# Patient Record
Sex: Female | Born: 1965 | Race: Black or African American | Hispanic: No | Marital: Single | Smoking: Never smoker
Health system: Southern US, Community
[De-identification: ages and names within clinical notes are randomized; demographics above are authoritative.]

## PROBLEM LIST (undated history)

## (undated) DIAGNOSIS — Z9889 Other specified postprocedural states: Secondary | ICD-10-CM

## (undated) DIAGNOSIS — C801 Malignant (primary) neoplasm, unspecified: Secondary | ICD-10-CM

## (undated) DIAGNOSIS — L930 Discoid lupus erythematosus: Secondary | ICD-10-CM

## (undated) DIAGNOSIS — R112 Nausea with vomiting, unspecified: Secondary | ICD-10-CM

## (undated) HISTORY — PX: BREAST SURGERY: SHX581

---

## 1999-12-13 HISTORY — PX: BREAST LUMPECTOMY: SHX2

## 2000-01-13 DIAGNOSIS — C801 Malignant (primary) neoplasm, unspecified: Secondary | ICD-10-CM

## 2000-01-13 HISTORY — DX: Malignant (primary) neoplasm, unspecified: C80.1

## 2002-12-12 HISTORY — PX: TOE SURGERY: SHX1073

## 2004-12-12 HISTORY — PX: CHOLECYSTECTOMY: SHX55

## 2014-02-13 ENCOUNTER — Other Ambulatory Visit: Payer: Self-pay | Admitting: Podiatry

## 2014-02-28 NOTE — Addendum Note (Signed)
Addended by: Caprice Beaver on: 02/28/2014 02:27 PM   Modules accepted: Orders

## 2014-03-14 ENCOUNTER — Encounter (HOSPITAL_COMMUNITY): Payer: Self-pay | Admitting: Pharmacy Technician

## 2014-03-19 ENCOUNTER — Encounter (HOSPITAL_COMMUNITY): Payer: Self-pay

## 2014-03-19 ENCOUNTER — Encounter (HOSPITAL_COMMUNITY)
Admission: RE | Admit: 2014-03-19 | Discharge: 2014-03-19 | Disposition: A | Discharge status: Home / Self Care | Payer: BC Managed Care – PPO | Source: Ambulatory Visit | Attending: Podiatry | Admitting: Podiatry

## 2014-03-19 ENCOUNTER — Ambulatory Visit (HOSPITAL_COMMUNITY)
Admission: RE | Admit: 2014-03-19 | Discharge: 2014-03-19 | Disposition: A | Discharge status: Home / Self Care | Payer: BC Managed Care – PPO | Source: Ambulatory Visit | Attending: Podiatry | Admitting: Podiatry

## 2014-03-19 DIAGNOSIS — Z01812 Encounter for preprocedural laboratory examination: Secondary | ICD-10-CM | POA: Insufficient documentation

## 2014-03-19 DIAGNOSIS — M2010 Hallux valgus (acquired), unspecified foot: Secondary | ICD-10-CM | POA: Insufficient documentation

## 2014-03-19 HISTORY — DX: Malignant (primary) neoplasm, unspecified: C80.1

## 2014-03-19 HISTORY — DX: Nausea with vomiting, unspecified: R11.2

## 2014-03-19 HISTORY — DX: Discoid lupus erythematosus: L93.0

## 2014-03-19 HISTORY — DX: Other specified postprocedural states: Z98.890

## 2014-03-19 LAB — BASIC METABOLIC PANEL
BUN: 11 mg/dL (ref 6–23)
CHLORIDE: 105 meq/L (ref 96–112)
CO2: 29 mEq/L (ref 19–32)
Calcium: 9 mg/dL (ref 8.4–10.5)
Creatinine, Ser: 1.14 mg/dL — ABNORMAL HIGH (ref 0.50–1.10)
GFR calc Af Amer: 66 mL/min — ABNORMAL LOW (ref >90)
GFR calc non Af Amer: 57 mL/min — ABNORMAL LOW (ref >90)
GLUCOSE: 92 mg/dL (ref 70–99)
Potassium: 4.2 mEq/L (ref 3.7–5.3)
SODIUM: 143 meq/L (ref 137–147)

## 2014-03-19 LAB — HEMOGLOBIN AND HEMATOCRIT, BLOOD
HCT: 28.7 % — ABNORMAL LOW (ref 36.0–46.0)
HEMOGLOBIN: 9.9 g/dL — AB (ref 12.0–15.0)

## 2014-03-19 LAB — HCG, SERUM, QUALITATIVE: Preg, Serum: NEGATIVE

## 2014-03-19 NOTE — Patient Instructions (Signed)
Amy Gill  03/19/2014   Your procedure is scheduled on:  Thursday, 03/27/14  Report to [name] at 0800 AM.  Call this number if you have problems the morning of surgery: [phone]   Remember:   Do not eat food or drink liquids after midnight.   Take these medicines the morning of surgery with A SIP OF WATER: none   Do not wear jewelry, make-up or nail polish.  Do not wear lotions, powders, or perfumes. You may wear deodorant.  Do not shave 48 hours prior to surgery. Men may shave face and neck.  Do not bring valuables to the hospital.  [facility] is not responsible                  for any belongings or valuables.               Contacts, dentures or bridgework may not be worn into surgery.  Leave suitcase in the car. After surgery it may be brought to your room.  For patients admitted to the hospital, discharge time is determined by your                treatment team.               Patients discharged the day of surgery will not be allowed to drive  home.  Name and phone number of your driver: family  Special Instructions: Shower using CHG 2 nights before surgery and the night before surgery.  If you shower the day of surgery use CHG.  Use special wash - you have one bottle of CHG for all showers.  You should use approximately 1/3 of the bottle for each shower.   Please read over the following fact sheets that you were given: Pain Booklet, Surgical Site Infection Prevention, Anesthesia Post-op Instructions and Care and Recovery After Surgery   Bunionectomy A bunionectomy is surgery to remove a bunion. A bunion is an enlargement of the joint at the base of the big toe. It is made up of bone and soft tissue on the inside part of the joint. Over time, a painful lump appears on the inside of the joint. The big toe begins to point inward toward the second toe. New bone growth can occur and a bone spur may form. The pain eventually causes difficulty walking. A bunion usually results  from inflammation caused by the irritation of poorly fitting shoes. It often begins later in life. A bunionectomy is performed when nonsurgical treatment no longer works. When surgery is needed, the extent of the procedure will depend on the degree of deformity of the foot. Your surgeon will discuss with you the different procedures and what will work best for you depending on your age and health. LET YOUR CAREGIVER KNOW ABOUT:   Previous problems with anesthetics or medicines used to numb the skin.  Allergies to dyes, iodine, foods, and/or latex.  Medicines taken including herbs, eye drops, prescription medicines (especially medicines used to "thin the blood"), aspirin and other over-the-counter medicines, and steroids (by mouth or as a cream).  History of bleeding or blood problems.  Possibility of pregnancy, if this applies.  History of blood clots in your legs and/or lungs .  Previous surgery.  Other important health problems. RISKS AND COMPLICATIONS   Infection.  Pain.  Nerve damage.  Possibility that the bunion will recur. BEFORE THE PROCEDURE  You should be present 60 minutes prior to your procedure or as directed.  PROCEDURE  Surgery is often done so that you can go home the same day (outpatient). It may be done in a hospital or in an outpatient surgical center. An anesthetic will be used to help you sleep during the procedure. Sometimes, a spinal anesthetic is used to make you numb below the waist. A cut (incision) is made over the swollen area at the first joint of the big toe. The enlarged lump will be removed. If there is a need to reposition the bones of the big toe, this may require more than 1 incision. The bone itself may need to be cut. Screws and wires may be used in the repair. These can be removed at a later date. In severe cases, the entire joint may need to be removed and a joint replacement inserted. When done, the incision is closed with stitches (sutures). Skin  adhesive strips may be added for reinforcement. They help hold the incision closed.  AFTER THE PROCEDURE  Compression bandages (dressings) are then wrapped around the wound. This helps to keep the foot in alignment and reduce swelling. Your foot will be monitored for bleeding and swelling. You will need to stay for a few hours in the recovery area before being discharged. This allows time for the anesthesia to wear off. You will be discharged home when you are awake, stable, and doing well. HOME CARE INSTRUCTIONS   You can expect to return to normal activities within 6 to 8 weeks after surgery. The foot is at increased risk for swelling for several months. When you can expect to bear weight on the operated foot will depend on the extent of your surgery. The milder the deformity, the less tissue is removed and the sooner the return to normal activity level. During the recovery period, a special shoe, boot, or cast may be worn to accommodate the surgical bandage and to help provide stability to the foot.  Once you are home, an ice pack applied to the operative site may help with discomfort and keep swelling down. Stop using the ice if it causes discomfort.  Keep your feet raised (elevated) when possible to lessen swelling.  If you have an elastic bandage on your foot and you have numbness, tingling, or your foot becomes cold and blue, adjust the bandage to make it comfortable.  Change dressings as directed.  Keep the wound dry and clean. The wound may be washed gently with soap and water. Gently blot dry without rubbing. Do not take baths or use swimming pools or hot tubs for 10 days, or as instructed by your caregiver.  Only take over-the-counter or prescription medicines for pain, discomfort, or fever as directed by your caregiver.  You may continue a normal diet as directed.  For activity, use crutches with no weight bearing or your orthopedic shoe as directed. Continue to use crutches or a  cane as directed until you can stand without causing pain. SEEK MEDICAL CARE IF:   You have redness, swelling, bruising, or increasing pain in the wound.  There is pus coming from the wound.  You have drainage from a wound lasting longer than 1 day.  You have an oral temperature above 102 F (38.9 C).  You notice a bad smell coming from the wound or dressing.  The wound breaks open after sutures have been removed.  You develop dizzy episodes or fainting while standing.  You have persistent nausea or vomiting.  Your toes become cold.  Pain is not relieved with medicines.  SEEK IMMEDIATE MEDICAL CARE IF:   You develop a rash.  You have difficulty breathing.  You develop any reaction or side effects to medicines given.  Your toes are numb or blue, or you have severe pain. MAKE SURE YOU:   Understand these instructions.  Will watch your condition.  Will get help right away if you are not doing well or get worse. Document Released: 11/11/2005 Document Revised: 02/20/2012 Document Reviewed: 12/17/2007 [facility] Patient Information 2014 [place]. Bunionectomy, Care After Refer to this sheet for the next few weeks. These discharge instructions provide you with general information on caring for yourself after you leave the hospital. Your caregiver may also give you specific instructions. Your treatment has been planned according to the most current medical practices available, but unavoidable complications sometimes occur. If you have any problems or questions after discharge, please call your caregiver. HOME CARE INSTRUCTIONS   Resume normal diet and activities as directed or allowed.  Put ice on the affected area.  Put ice in a plastic bag.  Place a towel between your skin and the bag.  Leave the ice on for 15-20 minutes each hour while awake for the first couple days following surgery.  Change bandages (dressings) if necessary or as directed.  Only take  over-the-counter or prescription medicines for pain, discomfort, or fever as directed by your caregiver.  Make an appointment to see your caregiver for stitches (sutures) or staple removal as directed.  Put weight on the foot as your caregiver tells you. Most people are on crutches for the first week.  Do not wear high heels.  Wear your boot for 6 weeks or as directed by your caregiver. If you are told to use a splint or special shoe, do so until instructed otherwise.  Keep your foot raised (elevated). SEEK MEDICAL CARE IF:   You have increased bleeding from the wound.  You have redness, swelling, or increasing pain in the wound.  You have pus coming from the wound.  You have a fever.  You notice a bad smell coming from the wound or dressing. SEEK IMMEDIATE MEDICAL CARE IF:   You have a rash.  You have difficulty breathing.  You have any reaction or side effects to medicines given. MAKE SURE YOU:   Understand these instructions.  Will watch your condition.  Will get help right away if you are not doing well or get worse. Document Released: 06/17/2005 Document Revised: 09/18/2013 Document Reviewed: 12/17/2007 [facility] Patient Information 2014 [place].

## 2014-03-20 NOTE — Pre-Procedure Instructions (Signed)
H&H shown to Dr Gonzalez, no orders given. 

## 2014-03-27 ENCOUNTER — Ambulatory Visit (HOSPITAL_COMMUNITY): Payer: BC Managed Care – PPO

## 2014-03-27 ENCOUNTER — Ambulatory Visit (HOSPITAL_COMMUNITY): Payer: BC Managed Care – PPO | Admitting: Anesthesiology

## 2014-03-27 ENCOUNTER — Encounter (HOSPITAL_COMMUNITY): Payer: Self-pay | Admitting: *Deleted

## 2014-03-27 ENCOUNTER — Encounter (HOSPITAL_COMMUNITY)
Admission: RE | Disposition: A | Discharge status: Home Health | Payer: Self-pay | Source: Ambulatory Visit | Attending: Podiatry

## 2014-03-27 ENCOUNTER — Encounter (HOSPITAL_COMMUNITY): Payer: BC Managed Care – PPO | Admitting: Anesthesiology

## 2014-03-27 ENCOUNTER — Ambulatory Visit (HOSPITAL_COMMUNITY)
Admission: RE | Admit: 2014-03-27 | Discharge: 2014-03-27 | Disposition: A | Discharge status: Home Health | Payer: BC Managed Care – PPO | Source: Ambulatory Visit | Attending: Podiatry | Admitting: Podiatry

## 2014-03-27 DIAGNOSIS — M2010 Hallux valgus (acquired), unspecified foot: Secondary | ICD-10-CM | POA: Insufficient documentation

## 2014-03-27 DIAGNOSIS — M2012 Hallux valgus (acquired), left foot: Secondary | ICD-10-CM

## 2014-03-27 HISTORY — PX: BUNIONECTOMY: SHX129

## 2014-03-27 HISTORY — PX: AIKEN OSTEOTOMY: SHX6331

## 2014-03-27 SURGERY — BUNIONECTOMY
Anesthesia: Monitor Anesthesia Care | Site: Foot | Laterality: Left

## 2014-03-27 MED ORDER — CEFAZOLIN SODIUM-DEXTROSE 2-3 GM-% IV SOLR
INTRAVENOUS | Status: AC
  Filled 2014-03-27: qty 50

## 2014-03-27 MED ORDER — CEFAZOLIN SODIUM-DEXTROSE 2-3 GM-% IV SOLR
2.0000 g | INTRAVENOUS | Status: AC
  Administered 2014-03-27: 2 g via INTRAVENOUS

## 2014-03-27 MED ORDER — LACTATED RINGERS IV SOLN
INTRAVENOUS | Status: DC
  Administered 2014-03-27: 09:00:00 via INTRAVENOUS

## 2014-03-27 MED ORDER — FENTANYL CITRATE 0.05 MG/ML IJ SOLN
INTRAMUSCULAR | Status: AC
  Filled 2014-03-27: qty 2

## 2014-03-27 MED ORDER — LIDOCAINE HCL (PF) 1 % IJ SOLN
INTRAMUSCULAR | Status: AC
  Filled 2014-03-27: qty 30

## 2014-03-27 MED ORDER — PROPOFOL INFUSION 10 MG/ML OPTIME
INTRAVENOUS | Status: DC | PRN
Start: 2014-03-27 — End: 2014-03-27
  Administered 2014-03-27: 50 ug/kg/min via INTRAVENOUS
  Administered 2014-03-27: 10:00:00 via INTRAVENOUS

## 2014-03-27 MED ORDER — ONDANSETRON HCL 4 MG/2ML IJ SOLN
4.0000 mg | Freq: Once | INTRAMUSCULAR | Status: AC
  Administered 2014-03-27: 4 mg via INTRAVENOUS

## 2014-03-27 MED ORDER — MIDAZOLAM HCL 2 MG/2ML IJ SOLN
INTRAMUSCULAR | Status: AC
  Filled 2014-03-27: qty 2

## 2014-03-27 MED ORDER — ONDANSETRON HCL 4 MG/2ML IJ SOLN: 4.0000 mg | Freq: Once | INTRAMUSCULAR | Status: DC | PRN

## 2014-03-27 MED ORDER — PROPOFOL 10 MG/ML IV EMUL
INTRAVENOUS | Status: AC
  Filled 2014-03-27: qty 20

## 2014-03-27 MED ORDER — MIDAZOLAM HCL 5 MG/5ML IJ SOLN
INTRAMUSCULAR | Status: DC | PRN
  Administered 2014-03-27: 2 mg via INTRAVENOUS

## 2014-03-27 MED ORDER — FENTANYL CITRATE 0.05 MG/ML IJ SOLN
25.0000 ug | INTRAMUSCULAR | Status: AC
  Administered 2014-03-27 (×2): 25 ug via INTRAVENOUS

## 2014-03-27 MED ORDER — BUPIVACAINE HCL (PF) 0.5 % IJ SOLN
INTRAMUSCULAR | Status: DC | PRN
  Administered 2014-03-27: 20 mL

## 2014-03-27 MED ORDER — BUPIVACAINE HCL (PF) 0.5 % IJ SOLN
INTRAMUSCULAR | Status: AC
  Filled 2014-03-27: qty 30

## 2014-03-27 MED ORDER — FENTANYL CITRATE 0.05 MG/ML IJ SOLN
INTRAMUSCULAR | Status: DC | PRN
  Administered 2014-03-27 (×3): 25 ug via INTRAVENOUS

## 2014-03-27 MED ORDER — FENTANYL CITRATE 0.05 MG/ML IJ SOLN: 25.0000 ug | INTRAMUSCULAR | Status: DC | PRN

## 2014-03-27 MED ORDER — ONDANSETRON HCL 4 MG/2ML IJ SOLN
INTRAMUSCULAR | Status: AC
  Filled 2014-03-27: qty 2

## 2014-03-27 MED ORDER — DEXAMETHASONE SODIUM PHOSPHATE 4 MG/ML IJ SOLN
4.0000 mg | Freq: Once | INTRAMUSCULAR | Status: AC
  Administered 2014-03-27: 4 mg via INTRAVENOUS

## 2014-03-27 MED ORDER — MIDAZOLAM HCL 2 MG/2ML IJ SOLN
1.0000 mg | INTRAMUSCULAR | Status: DC | PRN
  Administered 2014-03-27: 2 mg via INTRAVENOUS

## 2014-03-27 MED ORDER — DEXAMETHASONE SODIUM PHOSPHATE 4 MG/ML IJ SOLN
INTRAMUSCULAR | Status: AC
  Filled 2014-03-27: qty 1

## 2014-03-27 MED ORDER — SODIUM CHLORIDE 0.9 % IR SOLN
Status: DC | PRN
  Administered 2014-03-27: 1000 mL

## 2014-03-27 SURGICAL SUPPLY — 61 items
BAG HAMPER (MISCELLANEOUS) ×3 IMPLANT
BANDAGE CONFORM 2  STR LF (GAUZE/BANDAGES/DRESSINGS) ×3 IMPLANT
BANDAGE ELASTIC 4 VELCRO NS (GAUZE/BANDAGES/DRESSINGS) ×3 IMPLANT
BANDAGE ESMARK 4X12 BL STRL LF (DISPOSABLE) ×1 IMPLANT
BANDAGE GAUZE ELAST BULKY 4 IN (GAUZE/BANDAGES/DRESSINGS) ×3 IMPLANT
BENZOIN TINCTURE PRP APPL 2/3 (GAUZE/BANDAGES/DRESSINGS) ×3 IMPLANT
BIT DRILL MICR ACTRK 2 LNG PRF (BIT) ×1 IMPLANT
BLADE AVERAGE 25MMX9MM (BLADE) ×1
BLADE AVERAGE 25X9 (BLADE) ×2 IMPLANT
BLADE SURG 15 STRL LF DISP TIS (BLADE) ×3 IMPLANT
BLADE SURG 15 STRL SS (BLADE) ×6
BNDG CONFORM 2 STRL LF (GAUZE/BANDAGES/DRESSINGS) ×3 IMPLANT
BNDG ESMARK 4X12 BLUE STRL LF (DISPOSABLE) ×3
BNDG GAUZE ELAST 4 BULKY (GAUZE/BANDAGES/DRESSINGS) ×3 IMPLANT
BOOT STEPPER DURA LG (SOFTGOODS) IMPLANT
BOOT STEPPER DURA MED (SOFTGOODS) ×3 IMPLANT
BOOT STEPPER DURA SM (SOFTGOODS) IMPLANT
CHLORAPREP W/TINT 26ML (MISCELLANEOUS) ×3 IMPLANT
CLOSURE WOUND 1/2 X4 (GAUZE/BANDAGES/DRESSINGS) ×1
CLOTH BEACON ORANGE TIMEOUT ST (SAFETY) ×3 IMPLANT
COVER LIGHT HANDLE STERIS (MISCELLANEOUS) ×6 IMPLANT
CUFF TOURNIQUET SINGLE 18IN (TOURNIQUET CUFF) ×3 IMPLANT
DECANTER SPIKE VIAL GLASS SM (MISCELLANEOUS) ×3 IMPLANT
DRAPE OEC MINIVIEW 54X84 (DRAPES) ×3 IMPLANT
DRILL MICRO ACUTRAK 2 LNG PROF (BIT) ×3
DRSG ADAPTIC 3X8 NADH LF (GAUZE/BANDAGES/DRESSINGS) ×3 IMPLANT
DURA STEPPER LG (CAST SUPPLIES) IMPLANT
DURA STEPPER MED (CAST SUPPLIES) IMPLANT
DURA STEPPER SML (CAST SUPPLIES) IMPLANT
DURA STEPPER XL (SOFTGOODS) IMPLANT
ELECT REM PT RETURN 9FT ADLT (ELECTROSURGICAL) ×3
ELECTRODE REM PT RTRN 9FT ADLT (ELECTROSURGICAL) ×1 IMPLANT
GLOVE BIO SURGEON STRL SZ7.5 (GLOVE) ×3 IMPLANT
GLOVE BIOGEL PI IND STRL 7.0 (GLOVE) ×1 IMPLANT
GLOVE BIOGEL PI IND STRL 7.5 (GLOVE) ×1 IMPLANT
GLOVE BIOGEL PI INDICATOR 7.0 (GLOVE) ×2
GLOVE BIOGEL PI INDICATOR 7.5 (GLOVE) ×2
GLOVE ECLIPSE 6.5 STRL STRAW (GLOVE) ×3 IMPLANT
GLOVE ECLIPSE 7.0 STRL STRAW (GLOVE) ×3 IMPLANT
GLOVE EXAM NITRILE MD LF STRL (GLOVE) ×3 IMPLANT
GOWN STRL REUS W/TWL LRG LVL3 (GOWN DISPOSABLE) ×9 IMPLANT
GUIDEWIRE ORTHO MICROSHT  ACUT (WIRE) ×2
GUIDEWIRE ORTHO MICROSHT .035 (WIRE) ×1 IMPLANT
KIT ROOM TURNOVER AP CYSTO (KITS) ×3 IMPLANT
MANIFOLD NEPTUNE II (INSTRUMENTS) ×3 IMPLANT
NEEDLE HYPO 27GX1-1/4 (NEEDLE) ×6 IMPLANT
NS IRRIG 1000ML POUR BTL (IV SOLUTION) ×3 IMPLANT
PACK BASIC LIMB (CUSTOM PROCEDURE TRAY) ×3 IMPLANT
PAD ARMBOARD 7.5X6 YLW CONV (MISCELLANEOUS) ×3 IMPLANT
RASP SM TEAR CROSS CUT (RASP) IMPLANT
SCREW ACUTRAK 2 MICRO 20MM (Screw) ×3 IMPLANT
SET BASIN LINEN APH (SET/KITS/TRAYS/PACK) ×3 IMPLANT
SPONGE GAUZE 4X4 12PLY (GAUZE/BANDAGES/DRESSINGS) ×3 IMPLANT
SPONGE LAP 18X18 X RAY DECT (DISPOSABLE) ×3 IMPLANT
STAPLE FIXATION 8X8X8 (Staple) ×3 IMPLANT
STRIP CLOSURE SKIN 1/2X4 (GAUZE/BANDAGES/DRESSINGS) ×2 IMPLANT
SUT PROLENE 4 0 PS 2 18 (SUTURE) IMPLANT
SUT VIC AB 2-0 CT2 27 (SUTURE) IMPLANT
SUT VIC AB 4-0 PS2 27 (SUTURE) ×3 IMPLANT
SUT VICRYL AB 3-0 FS1 BRD 27IN (SUTURE) ×3 IMPLANT
SYR CONTROL 10ML LL (SYRINGE) ×6 IMPLANT

## 2014-03-27 NOTE — Transfer of Care (Signed)
Immediate Anesthesia Transfer of Care Note  Patient: Amy Gill  Procedure(s) Performed: Procedure(s): AUSTIN BUNIONECTOMY (Left) AKIN OSTEOTOMY (Left)  Patient Location: PACU  Anesthesia Type:MAC  Level of Consciousness: awake, alert  and patient cooperative  Airway & Oxygen Therapy: Patient Spontanous Breathing and Patient connected to face mask oxygen  Post-op Assessment: Report given to PACU RN, Post -op Vital signs reviewed and stable and Patient moving all extremities  Post vital signs: Reviewed and stable  Complications: No apparent anesthesia complications

## 2014-03-27 NOTE — Discharge Instructions (Signed)
Bunionectomy, Care After Refer to this sheet for the next few weeks. These discharge instructions provide you with general information on caring for yourself after you leave the hospital. Your caregiver may also give you specific instructions. Your treatment has been planned according to the most current medical practices available, but unavoidable complications sometimes occur. If you have any problems or questions after discharge, please call your caregiver. HOME CARE INSTRUCTIONS   Resume normal diet and activities as directed or allowed.  Put ice on the affected area.  Put ice in a plastic bag.  Place a towel between your skin and the bag.  Leave the ice on for 15-20 minutes each hour while awake for the first couple days following surgery.  Change bandages (dressings) if necessary or as directed.  Only take over-the-counter or prescription medicines for pain, discomfort, or fever as directed by your caregiver.  Make an appointment to see your caregiver for stitches (sutures) or staple removal as directed.  Put weight on the foot as your caregiver tells you. Most people are on crutches for the first week.  Do not wear high heels.  Wear your boot for 6 weeks or as directed by your caregiver. If you are told to use a splint or special shoe, do so until instructed otherwise.  Keep your foot raised (elevated). SEEK MEDICAL CARE IF:   You have increased bleeding from the wound.  You have redness, swelling, or increasing pain in the wound.  You have pus coming from the wound.  You have a fever.  You notice a bad smell coming from the wound or dressing. SEEK IMMEDIATE MEDICAL CARE IF:   You have a rash.  You have difficulty breathing.  You have any reaction or side effects to medicines given. MAKE SURE YOU:   Understand these instructions.  Will watch your condition.  Will get help right away if you are not doing well or get worse. Document Released: 06/17/2005  Document Revised: 09/18/2013 Document Reviewed: 12/17/2007 [facility] Patient Information 2014 [place].

## 2014-03-27 NOTE — Anesthesia Preprocedure Evaluation (Signed)
Anesthesia Evaluation  Patient identified by MRN, date of birth, ID band Patient awake    Reviewed: Allergy & Precautions, H&P , NPO status , Patient's Chart, lab work & pertinent test results  History of Anesthesia Complications (+) PONV and history of anesthetic complications  Airway Mallampati: II TM Distance: >3 FB     Dental  (+) Teeth Intact   Pulmonary neg pulmonary ROS,  breath sounds clear to auscultation        Cardiovascular negative cardio ROS  Rhythm:Regular Rate:Normal     Neuro/Psych    GI/Hepatic negative GI ROS,   Endo/Other    Renal/GU      Musculoskeletal   Abdominal   Peds  Hematology  (+) anemia ,   Anesthesia Other Findings Hx Lupus Hx R breast CA w/ node dissection  Reproductive/Obstetrics                           Anesthesia Physical Anesthesia Plan  ASA: II  Anesthesia Plan: MAC   Post-op Pain Management:    Induction: Intravenous  Airway Management Planned: Nasal Cannula  Additional Equipment:   Intra-op Plan:   Post-operative Plan:   Informed Consent: I have reviewed the patients History and Physical, chart, labs and discussed the procedure including the risks, benefits and alternatives for the proposed anesthesia with the patient or authorized representative who has indicated his/her understanding and acceptance.     Plan Discussed with:   Anesthesia Plan Comments:         Anesthesia Quick Evaluation

## 2014-03-27 NOTE — H&P (Signed)
HISTORY AND PHYSICAL INTERVAL NOTE:  03/27/2014  9:13 AM  Amy Gill  has presented today for surgery, with the diagnosis of hallux valgus left foot.  The various methods of treatment have been discussed with the patient.  No guarantees were given.  After consideration of risks, benefits and other options for treatment, the patient has consented to surgery.  I have reviewed the patients' chart and labs.    Patient Vitals for the past 24 hrs:  BP Temp Temp src Pulse SpO2  03/27/14 0811 124/74 mmHg 98.1 F (36.7 C) Oral 67 100 %    A history and physical examination was performed in my office.  The patient was reexamined.  There have been no changes to this history and physical examination.  Marcheta Grammes, DPM

## 2014-03-27 NOTE — Anesthesia Postprocedure Evaluation (Signed)
  Anesthesia Post-op Note  Patient: Amy Gill  Procedure(s) Performed: Procedure(s): AUSTIN BUNIONECTOMY (Left) AKIN OSTEOTOMY (Left)  Patient Location: PACU  Anesthesia Type:MAC  Level of Consciousness: awake, alert , oriented and patient cooperative  Airway and Oxygen Therapy: Patient Spontanous Breathing  Post-op Pain: 3 /10, mild  Post-op Assessment: Post-op Vital signs reviewed, Patient's Cardiovascular Status Stable, Respiratory Function Stable, Patent Airway, No signs of Nausea or vomiting and Pain level controlled  Post-op Vital Signs: Reviewed and stable  Last Vitals:  Filed Vitals:   03/27/14 0930  BP: 106/71  Pulse:   Temp:   Resp: 15    Complications: No apparent anesthesia complications

## 2014-03-27 NOTE — Op Note (Signed)
OPERATIVE NOTE  DATE OF PROCEDURE:  03/27/2014  SURGEON:   Marcheta Grammes, DPM  OR STAFF:   Circulator: Effie Berkshire, RN Scrub Person: Romero Liner, CST RN First Assistant: Towanda Malkin, RN   PREOPERATIVE DIAGNOSIS:   Hallux valgus, left foot  POSTOPERATIVE DIAGNOSIS: Same  PROCEDURE: 1.  [place] bunionectomy, left foot 2.  Akin osteotomy, left foot  ANESTHESIA:  Monitor Anesthesia Care   HEMOSTASIS:   Pneumatic ankle tourniquet set at 250 mmHg  ESTIMATED BLOOD LOSS:   Minimal (<5 cc)  MATERIALS USED:  1.  AccuMed screw 2.  Stryker [employer]  INJECTABLES: Marcaine 0.5% plain; 28mL  PATHOLOGY:   None  COMPLICATIONS:   None  INDICATIONS:  This 48 year old female has a history of pain in her left foot.  The pain is located on the bunion deformity of the left foot.  Her pain has not responded to change in shoes.  DESCRIPTION OF THE PROCEDURE:   The patient was brought to the operating room and placed on the operative table in the supine position.  A pneumatic ankle tourniquet was applied to the patient's ankle.  Following sedation, the surgical site was anesthetized with 0.5% Marcaine plain.  The foot was then prepped, scrubbed, and draped in the usual sterile technique.  The foot was elevated, exsanguinated and the pneumatic ankle tourniquet inflated to 250 mmHg.    Attention was then directed to the dorsomedial aspect of the first metatarsal phalangeal joint where a linear incision was made medial and parallel to the course of the extensor hallucis longus tendon.  The incision was deepened through subcutaneous tissues.  Vital neurovascular structures were identified and retracted.  All bleeders were identified and cauterized.  The incision was deepened to the level of the first metatarsal phalangeal joint capsule.  A T-type capsulotomy was performed over the dorsal aspect of the first metatarsal phalangeal joint.  The capsular and periosteal  structures were dissected free of their osseous attachments and reflected medially and laterally thus exposing the head of the first metatarsal and proximal phalanx at the operative site.  Utilizing a sagittal saw, the medial prominence was resected and passed from the operative field.  All rough edges were smoothed.    Attention was directed to the first interspace via the original skin incision.  Using sharp and blunt dissection, the fibular sesamoid was freed of its soft tissue attachments proximally, laterally and distally.  The conjoined tendon of the adductor hallucis muscle was identified and transected at its attachment to the base of the proximal phalanx of the hallux.  The lateral contracture present on the hallux was noted to be reduced and the sesamoid apparatus was noted to float into a more corrected medial position.    Attention was redirected to the medial aspect of the first metatarsal head where a through and through chevron osteotomy was created in the metaphyseal region of the first metatarsal bone using a sagittal bone saw.  The apex of the osteotomy pointed distally.  Upon completion of the osteotomy the capital fragment was distracted and shifted laterally into a more corrected position.  A k-wire from the AccuMed cannulated screw set was inserted across the osteotomy site from a dorsal proximal to plantar distal direction.  An AccuMed cannulated screw was inserted across the k-wire.  Adequate compression was noted.  The position of the screw was confirmed with fluoroscopy and noted to be in acceptable alignment.  The remaining medial bone shelf was resected utilizing a  sagittal bone saw and passed from the operative field.  The area was copiously irrigated.   Attention was then directed to the proximal phalanx of the hallux.  A wedge-shaped osteotomy was performed.  The wedge of bone was removed and passed from the operative field.  The lateral hinge was then feathered to allow for  closure of the osteotomy.  The osteotomy was fixated using a [employer]ker EasyClip.  The position of the staple was confirmed with fluoroscopy and noted to be in acceptable alignment.  The area was copiously irrigated.  The periosteal and capsular tissues were approximated with 2-0 Vicryl suture.  4-0 Vicryl was used to approximate the subcutaneous tissues. 4-0 Vicryl was used to approximate the skin in a subcuticular manner.    The incision site was reinforced with Steri-Strips.  A sterile compressive dressing was applied to the operative foot.  The patient's ankle tourniquet was deflated.  A prompt hyperemic response was noted to all digits of the operative foot.    The patient tolerated the procedure well.  The patient was then transferred to PACU with vital signs stable and vascular status intact to all toes of the operative foot.  Following a period of postoperative monitoring, the patient will be discharged home.

## 2014-03-31 ENCOUNTER — Encounter (HOSPITAL_COMMUNITY): Payer: Self-pay | Admitting: Podiatry

## 2017-08-21 ENCOUNTER — Other Ambulatory Visit (HOSPITAL_COMMUNITY): Payer: Self-pay

## 2017-08-23 ENCOUNTER — Encounter (HOSPITAL_COMMUNITY): Payer: Self-pay

## 2017-08-23 ENCOUNTER — Ambulatory Visit (HOSPITAL_COMMUNITY): Admit: 2017-08-23 | Payer: Self-pay | Admitting: Podiatry

## 2017-08-23 SURGERY — BUNIONECTOMY
Anesthesia: Monitor Anesthesia Care | Laterality: Right

## 2021-04-05 ENCOUNTER — Other Ambulatory Visit (HOSPITAL_COMMUNITY): Payer: Self-pay

## 2021-04-07 ENCOUNTER — Other Ambulatory Visit: Payer: Self-pay | Admitting: Podiatry

## 2021-04-07 DIAGNOSIS — M79671 Pain in right foot: Secondary | ICD-10-CM

## 2021-04-15 NOTE — Patient Instructions (Signed)
Amy Gill  04/15/2021     @PREFPERIOPPHARMACY @   Your procedure is scheduled on  04/21/2021.   Report to [name] at  [phone]  A.M.   Call this number if you have problems the morning of surgery:  [phone]   Remember:  Do not eat or drink after midnight.                         Take these medicines the morning of surgery with A SIP OF WATER  Plaquenil.    Do not wear jewelry, make-up or nail polish.  Do not wear lotions, powders, or perfumes, or deodorant.  Do not shave 48 hours prior to surgery.  Men may shave face and neck.  Do not bring valuables to the hospital.  [facility] is not responsible for any belongings or valuables.   Place clean sheets on your bed the night before your procedure and DO NOT sleep with pets this night.   Shower with CHG the night before and the morning of your procedure. DO NOT use CHG on your face, hair or genitals.   After each shower, dry off with a clean towel, put on clean, comfortable clothes and brush your teeth.  Contacts, dentures or bridgework may not be worn into surgery.  Leave your suitcase in the car.  After surgery it may be brought to your room.  For patients admitted to the hospital, discharge time will be determined by your treatment team.  Patients discharged the day of surgery will not be allowed to drive home and must have someone with them for 24 hours.     Special instructions:  DO NOT smoke tobacco or vape for 24 hours before your procedure.   Please read over the following fact sheets that you were given. Coughing and Deep Breathing, Surgical Site Infection Prevention, Anesthesia Post-op Instructions and Care and Recovery After Surgery       Bunion Surgery, Care After This sheet gives you information about how to care for yourself after your procedure. Your health care provider may also give you more specific instructions. If you have problems or questions, contact your health care  provider. What can I expect after the procedure? After the procedure, it is common to have:  Redness.  Pain.  Swelling.  A small amount of fluid coming from your incision. Follow these instructions at home: If you have a post-operative brace, boot, or shoe:  Wear the post-op (post-operative) brace, boot, or shoe as told by your health care provider. Remove it only as told by your health care provider.  Loosen the brace, boot, or shoe if your toes tingle, become numb, or turn cold and blue.  Keep the brace, boot, or shoe clean and dry. If you have a cast:  Do not put pressure on any part of the cast until it is fully hardened. This may take several hours.  Do not stick anything inside the cast to scratch your skin. Doing that increases your risk of infection.  Check the skin around the cast every day. Tell your health care provider about any concerns.  You may put lotion on dry skin around the edges of the cast. Do not put lotion on the skin underneath the cast.  Keep the cast clean and dry. Bathing  Do not take baths, swim, or use a hot tub until your health care provider approves. Ask your health care provider if  you may take showers.  If your brace, boot, shoe, or cast is not waterproof: ? Do not let it get wet. ? Cover it with a watertight covering when you take a bath or a shower.  Keep your bandage (dressing) dry until your health care provider says it can be removed. Incision care  The dressing holds your toe in the correct position. Do not change the dressing until your health care provider approves.  Follow instructions from your health care provider about how to take care of your incision. Make sure you: ? Wash your hands with soap and water for at least 20 seconds before and after you change your dressing. If soap and water are not available, use hand sanitizer. ? Change your dressing as told by your health care provider. ? Leave stitches (sutures), skin glue,  or adhesive strips in place. These skin closures may need to stay in place for 2 weeks or longer. If adhesive strip edges start to loosen and curl up, you may trim the loose edges. Do not remove adhesive strips completely unless your health care provider tells you to do that.  Check your incision area every day for signs of infection. Check for: ? More redness, swelling, or pain. ? Blood or more fluid. ? Warmth. ? Pus or a bad smell.   Managing pain, stiffness, and swelling  If directed, put ice on the affected area. To do this: ? If you have a removable brace, boot, or shoe, remove it as told by your health care provider. ? Put ice in a plastic bag. ? Place a towel between your skin and the bag or between your cast and the bag. ? Leave the ice on for 20 minutes, 2-3 times a day. ? Remove the ice if your skin turns bright red. This is very important. If you cannot feel pain, heat, or cold, you have a greater risk of damage to the area.  Move your toes often to reduce stiffness and swelling.  Raise (elevate) the injured area above the level of your heart while you are sitting or lying down.   Activity  Return to your normal activities as told by your health care provider. Ask your health care provider what activities are safe for you.  If physical therapy was prescribed, do exercises as told by your health care provider. Driving  If you were given a sedative during the procedure, it can affect you for several hours. Do not drive or operate machinery until your health care provider says that it is safe.  Ask your health care provider when it is safe to drive if you have a brace, boot, shoe, or cast on your foot. Safety Do not use the affected leg to support (bear) your body weight until your health care provider says that you can. Follow weight-bearing restrictions as told. Use crutches, a cane, or a walker as told by your health care provider. General instructions  Do not use any  products that contain nicotine or tobacco, such as cigarettes, e-cigarettes, and chewing tobacco. If you need help quitting, ask your health care provider.  Take over-the-counter and prescription medicines only as told by your health care provider.  Your medicines may cause constipation. To prevent or treat constipation, you may need to: ? Drink enough fluid to keep your urine pale yellow. ? Take over-the-counter or prescription medicines. ? Eat foods that are high in fiber, such as beans, whole grains, and fresh fruits and vegetables. ? Limit foods that  are high in fat and processed sugars, such as fried or sweet foods.  Do not wear high heels or tight-fitting shoes, even after you heal.  Keep all follow-up visits. This is important. Contact a health care provider if:  You have any of these signs of infection: ? More redness, swelling, or pain around your incision. ? Blood or more fluid coming from your incision. ? Warmth coming from your incision. ? Pus or a bad smell coming from your incision. ? A fever or chills.  Your dressing gets wet or it falls off.  You have swelling in your lower leg or calf.  You have numbness or stiffness in your toes. Get help right away if:  You have severe pain.  You have shortness of breath or trouble breathing.  You have chest pain.  You have redness, swelling, pain, or warmth in your calf or leg. These symptoms may represent a serious problem that is an emergency. Do not wait to see if the symptoms will go away. Get medical help right away. Call your local emergency services (911 in the [place] Do not drive yourself to the hospital. Summary  If directed, put ice on the affected area. Leave the ice on for 20 minutes, 2-3 times a day.  Ask your health care provider when it is safe to drive if you have a brace, boot, shoe, or cast on your foot.  Do not use the affected leg to support (bear) your body weight until your health care provider says  that you can. Follow weight-bearing restrictions as told. Use crutches, a cane, or a walker as told by your health care provider. This information is not intended to replace advice given to you by your health care provider. Make sure you discuss any questions you have with your health care provider. Document Revised: 04/03/2020 Document Reviewed: 04/03/2020 Elsevier Patient Education  2021 [place] After This sheet gives you information about how to care for yourself after your procedure. Your health care provider may also give you more specific instructions. If you have problems or questions, contact your health care provider. What can I expect after the procedure? After the procedure, it is common to have:  Tiredness.  Forgetfulness about what happened after the procedure.  Impaired judgment for important decisions.  Nausea or vomiting.  Some difficulty with balance. Follow these instructions at home: For the time period you were told by your health care provider:  Rest as needed.  Do not participate in activities where you could fall or become injured.  Do not drive or use machinery.  Do not drink alcohol.  Do not take sleeping pills or medicines that cause drowsiness.  Do not make important decisions or sign legal documents.  Do not take care of children on your own.      Eating and drinking  Follow the diet that is recommended by your health care provider.  Drink enough fluid to keep your urine pale yellow.  If you vomit: ? Drink water, juice, or soup when you can drink without vomiting. ? Make sure you have little or no nausea before eating solid foods. General instructions  Have a responsible adult stay with you for the time you are told. It is important to have someone help care for you until you are awake and alert.  Take over-the-counter and prescription medicines only as told by your health care provider.  If you have  sleep apnea, surgery and certain medicines can  increase your risk for breathing problems. Follow instructions from your health care provider about wearing your sleep device: ? Anytime you are sleeping, including during daytime naps. ? While taking prescription pain medicines, sleeping medicines, or medicines that make you drowsy.  Avoid smoking.  Keep all follow-up visits as told by your health care provider. This is important. Contact a health care provider if:  You keep feeling nauseous or you keep vomiting.  You feel light-headed.  You are still sleepy or having trouble with balance after 24 hours.  You develop a rash.  You have a fever.  You have redness or swelling around the IV site. Get help right away if:  You have trouble breathing.  You have new-onset confusion at home. Summary  For several hours after your procedure, you may feel tired. You may also be forgetful and have poor judgment.  Have a responsible adult stay with you for the time you are told. It is important to have someone help care for you until you are awake and alert.  Rest as told. Do not drive or operate machinery. Do not drink alcohol or take sleeping pills.  Get help right away if you have trouble breathing, or if you suddenly become confused. This information is not intended to replace advice given to you by your health care provider. Make sure you discuss any questions you have with your health care provider. Document Revised: 08/13/2020 Document Reviewed: 10/31/2019 Elsevier Patient Education  2021 [employer].

## 2021-04-19 ENCOUNTER — Encounter (HOSPITAL_COMMUNITY)
Admission: RE | Admit: 2021-04-19 | Discharge: 2021-04-19 | Disposition: A | Discharge status: Home / Self Care | Payer: BC Managed Care – PPO | Source: Ambulatory Visit | Attending: Podiatry | Admitting: Podiatry

## 2021-04-19 ENCOUNTER — Other Ambulatory Visit (HOSPITAL_COMMUNITY): Payer: Self-pay | Admitting: Podiatry

## 2021-04-19 ENCOUNTER — Other Ambulatory Visit: Payer: Self-pay

## 2021-04-19 ENCOUNTER — Ambulatory Visit (HOSPITAL_COMMUNITY)
Admission: RE | Admit: 2021-04-19 | Discharge: 2021-04-19 | Disposition: A | Discharge status: Home / Self Care | Payer: BC Managed Care – PPO | Source: Ambulatory Visit | Attending: Podiatry | Admitting: Podiatry

## 2021-04-19 ENCOUNTER — Encounter (HOSPITAL_COMMUNITY): Payer: Self-pay

## 2021-04-19 ENCOUNTER — Other Ambulatory Visit (HOSPITAL_COMMUNITY)
Admission: RE | Admit: 2021-04-19 | Discharge: 2021-04-19 | Disposition: A | Discharge status: Home / Self Care | Payer: BC Managed Care – PPO | Source: Ambulatory Visit | Attending: Podiatry | Admitting: Podiatry

## 2021-04-19 DIAGNOSIS — Z01812 Encounter for preprocedural laboratory examination: Secondary | ICD-10-CM | POA: Insufficient documentation

## 2021-04-19 DIAGNOSIS — M21621 Bunionette of right foot: Secondary | ICD-10-CM | POA: Diagnosis not present

## 2021-04-19 DIAGNOSIS — M2041 Other hammer toe(s) (acquired), right foot: Secondary | ICD-10-CM | POA: Diagnosis not present

## 2021-04-19 DIAGNOSIS — Z20822 Contact with and (suspected) exposure to covid-19: Secondary | ICD-10-CM | POA: Insufficient documentation

## 2021-04-19 DIAGNOSIS — Z853 Personal history of malignant neoplasm of breast: Secondary | ICD-10-CM | POA: Diagnosis not present

## 2021-04-19 DIAGNOSIS — Z882 Allergy status to sulfonamides status: Secondary | ICD-10-CM | POA: Diagnosis not present

## 2021-04-19 DIAGNOSIS — M79671 Pain in right foot: Secondary | ICD-10-CM | POA: Diagnosis not present

## 2021-04-19 DIAGNOSIS — Z79899 Other long term (current) drug therapy: Secondary | ICD-10-CM | POA: Diagnosis not present

## 2021-04-19 DIAGNOSIS — M329 Systemic lupus erythematosus, unspecified: Secondary | ICD-10-CM | POA: Diagnosis not present

## 2021-04-19 DIAGNOSIS — Z8249 Family history of ischemic heart disease and other diseases of the circulatory system: Secondary | ICD-10-CM | POA: Diagnosis not present

## 2021-04-19 DIAGNOSIS — M2011 Hallux valgus (acquired), right foot: Secondary | ICD-10-CM | POA: Diagnosis not present

## 2021-04-20 LAB — SARS CORONAVIRUS 2 (TAT 6-24 HRS): SARS Coronavirus 2: NEGATIVE

## 2021-04-21 ENCOUNTER — Encounter (HOSPITAL_COMMUNITY): Payer: Self-pay | Admitting: Podiatry

## 2021-04-21 ENCOUNTER — Ambulatory Visit (HOSPITAL_COMMUNITY): Payer: BC Managed Care – PPO

## 2021-04-21 ENCOUNTER — Encounter (HOSPITAL_COMMUNITY)
Admission: RE | Disposition: A | Discharge status: Home / Self Care | Payer: Self-pay | Source: Home / Self Care | Attending: Podiatry

## 2021-04-21 ENCOUNTER — Ambulatory Visit (HOSPITAL_COMMUNITY): Payer: BC Managed Care – PPO | Admitting: Anesthesiology

## 2021-04-21 ENCOUNTER — Ambulatory Visit (HOSPITAL_COMMUNITY)
Admission: RE | Admit: 2021-04-21 | Discharge: 2021-04-21 | Disposition: A | Discharge status: Home / Self Care | Payer: BC Managed Care – PPO | Attending: Podiatry | Admitting: Podiatry

## 2021-04-21 DIAGNOSIS — Z8249 Family history of ischemic heart disease and other diseases of the circulatory system: Secondary | ICD-10-CM | POA: Insufficient documentation

## 2021-04-21 DIAGNOSIS — M2011 Hallux valgus (acquired), right foot: Secondary | ICD-10-CM | POA: Diagnosis not present

## 2021-04-21 DIAGNOSIS — Z79899 Other long term (current) drug therapy: Secondary | ICD-10-CM | POA: Insufficient documentation

## 2021-04-21 DIAGNOSIS — M21611 Bunion of right foot: Secondary | ICD-10-CM

## 2021-04-21 DIAGNOSIS — M2041 Other hammer toe(s) (acquired), right foot: Secondary | ICD-10-CM | POA: Insufficient documentation

## 2021-04-21 DIAGNOSIS — Z853 Personal history of malignant neoplasm of breast: Secondary | ICD-10-CM | POA: Insufficient documentation

## 2021-04-21 DIAGNOSIS — Z20822 Contact with and (suspected) exposure to covid-19: Secondary | ICD-10-CM | POA: Insufficient documentation

## 2021-04-21 DIAGNOSIS — Z882 Allergy status to sulfonamides status: Secondary | ICD-10-CM | POA: Insufficient documentation

## 2021-04-21 DIAGNOSIS — M79671 Pain in right foot: Secondary | ICD-10-CM | POA: Insufficient documentation

## 2021-04-21 DIAGNOSIS — M329 Systemic lupus erythematosus, unspecified: Secondary | ICD-10-CM | POA: Insufficient documentation

## 2021-04-21 DIAGNOSIS — M21621 Bunionette of right foot: Secondary | ICD-10-CM | POA: Insufficient documentation

## 2021-04-21 DIAGNOSIS — Z9889 Other specified postprocedural states: Secondary | ICD-10-CM

## 2021-04-21 HISTORY — PX: BUNIONECTOMY: SHX129

## 2021-04-21 HISTORY — PX: HAMMER TOE SURGERY: SHX385

## 2021-04-21 HISTORY — PX: METATARSAL HEAD EXCISION: SHX5027

## 2021-04-21 HISTORY — PX: AIKEN OSTEOTOMY: SHX6331

## 2021-04-21 LAB — CBC WITH DIFFERENTIAL/PLATELET
Abs Immature Granulocytes: 0.01 10*3/uL (ref 0.00–0.07)
Basophils Absolute: 0 10*3/uL (ref 0.0–0.1)
Basophils Relative: 0 %
Eosinophils Absolute: 0 10*3/uL (ref 0.0–0.5)
Eosinophils Relative: 1 %
HCT: 30.8 % — ABNORMAL LOW (ref 36.0–46.0)
Hemoglobin: 10.7 g/dL — ABNORMAL LOW (ref 12.0–15.0)
Immature Granulocytes: 0 %
Lymphocytes Relative: 38 %
Lymphs Abs: 2 10*3/uL (ref 0.7–4.0)
MCH: 31.3 pg (ref 26.0–34.0)
MCHC: 34.7 g/dL (ref 30.0–36.0)
MCV: 90.1 fL (ref 80.0–100.0)
Monocytes Absolute: 0.6 10*3/uL (ref 0.1–1.0)
Monocytes Relative: 11 %
Neutro Abs: 2.6 10*3/uL (ref 1.7–7.7)
Neutrophils Relative %: 50 %
Platelets: 160 10*3/uL (ref 150–400)
RBC: 3.42 MIL/uL — ABNORMAL LOW (ref 3.87–5.11)
RDW: 13.7 % (ref 11.5–15.5)
WBC: 5.2 10*3/uL (ref 4.0–10.5)
nRBC: 0 % (ref 0.0–0.2)

## 2021-04-21 LAB — BASIC METABOLIC PANEL
Anion gap: 7 (ref 5–15)
BUN: 14 mg/dL (ref 6–20)
CO2: 26 mmol/L (ref 22–32)
Calcium: 8.7 mg/dL — ABNORMAL LOW (ref 8.9–10.3)
Chloride: 107 mmol/L (ref 98–111)
Creatinine, Ser: 0.93 mg/dL (ref 0.44–1.00)
GFR, Estimated: >60 mL/min (ref >60)
Glucose, Bld: 86 mg/dL (ref 70–99)
Potassium: 2.8 mmol/L — ABNORMAL LOW (ref 3.5–5.1)
Sodium: 140 mmol/L (ref 135–145)

## 2021-04-21 LAB — HCG, SERUM, QUALITATIVE: Preg, Serum: NEGATIVE

## 2021-04-21 SURGERY — BUNIONECTOMY
Anesthesia: General | Site: Toe | Laterality: Right

## 2021-04-21 MED ORDER — MIDAZOLAM HCL 2 MG/2ML IJ SOLN
INTRAMUSCULAR | Status: AC
  Filled 2021-04-21: qty 2

## 2021-04-21 MED ORDER — ORAL CARE MOUTH RINSE: 15.0000 mL | Freq: Once | OROMUCOSAL | Status: AC

## 2021-04-21 MED ORDER — PROPOFOL 10 MG/ML IV BOLUS
INTRAVENOUS | Status: AC
  Filled 2021-04-21: qty 20

## 2021-04-21 MED ORDER — BUPIVACAINE HCL (PF) 0.5 % IJ SOLN
INTRAMUSCULAR | Status: AC
  Filled 2021-04-21: qty 30

## 2021-04-21 MED ORDER — MIDAZOLAM HCL 5 MG/5ML IJ SOLN
INTRAMUSCULAR | Status: DC | PRN
  Administered 2021-04-21: 2 mg via INTRAVENOUS

## 2021-04-21 MED ORDER — 0.9 % SODIUM CHLORIDE (POUR BTL) OPTIME
TOPICAL | Status: DC | PRN
  Administered 2021-04-21: 1000 mL

## 2021-04-21 MED ORDER — PROPOFOL 500 MG/50ML IV EMUL
INTRAVENOUS | Status: DC | PRN
  Administered 2021-04-21: 50 ug/kg/min via INTRAVENOUS

## 2021-04-21 MED ORDER — CEFAZOLIN SODIUM-DEXTROSE 2-4 GM/100ML-% IV SOLN
2.0000 g | Freq: Once | INTRAVENOUS | Status: DC
  Filled 2021-04-21: qty 100

## 2021-04-21 MED ORDER — LIDOCAINE HCL (PF) 1 % IJ SOLN
INTRAMUSCULAR | Status: AC
  Filled 2021-04-21: qty 30

## 2021-04-21 MED ORDER — LACTATED RINGERS IV SOLN: INTRAVENOUS | Status: DC

## 2021-04-21 MED ORDER — LIDOCAINE HCL (PF) 2 % IJ SOLN
INTRAMUSCULAR | Status: AC
  Filled 2021-04-21: qty 5

## 2021-04-21 MED ORDER — DEXAMETHASONE SODIUM PHOSPHATE 10 MG/ML IJ SOLN
INTRAMUSCULAR | Status: AC
  Filled 2021-04-21: qty 1

## 2021-04-21 MED ORDER — PROPOFOL 10 MG/ML IV BOLUS
INTRAVENOUS | Status: DC | PRN
  Administered 2021-04-21: 30 mg via INTRAVENOUS
  Administered 2021-04-21: 50 mg via INTRAVENOUS
  Administered 2021-04-21: 20 mg via INTRAVENOUS

## 2021-04-21 MED ORDER — DEXAMETHASONE SODIUM PHOSPHATE 10 MG/ML IJ SOLN
INTRAMUSCULAR | Status: DC | PRN
  Administered 2021-04-21: 10 mg via INTRAVENOUS

## 2021-04-21 MED ORDER — HEPARIN SOD (PORK) LOCK FLUSH 100 UNIT/ML IV SOLN
500.0000 [IU] | Freq: Once | INTRAVENOUS | Status: AC
  Administered 2021-04-21: 500 [IU] via INTRAVENOUS

## 2021-04-21 MED ORDER — CEFAZOLIN SODIUM-DEXTROSE 2-3 GM-%(50ML) IV SOLR
INTRAVENOUS | Status: DC | PRN
  Administered 2021-04-21: 2 g via INTRAVENOUS

## 2021-04-21 MED ORDER — CHLORHEXIDINE GLUCONATE 0.12 % MT SOLN
15.0000 mL | Freq: Once | OROMUCOSAL | Status: AC
  Administered 2021-04-21: 15 mL via OROMUCOSAL
  Filled 2021-04-21: qty 15

## 2021-04-21 MED ORDER — MEPERIDINE HCL 50 MG/ML IJ SOLN: 6.2500 mg | INTRAMUSCULAR | Status: DC | PRN

## 2021-04-21 MED ORDER — FENTANYL CITRATE (PF) 100 MCG/2ML IJ SOLN
INTRAMUSCULAR | Status: DC | PRN
  Administered 2021-04-21: 100 ug via INTRAVENOUS

## 2021-04-21 MED ORDER — HEPARIN SOD (PORK) LOCK FLUSH 100 UNIT/ML IV SOLN
INTRAVENOUS | Status: AC
  Filled 2021-04-21: qty 5

## 2021-04-21 MED ORDER — FENTANYL CITRATE (PF) 100 MCG/2ML IJ SOLN
INTRAMUSCULAR | Status: AC
  Filled 2021-04-21: qty 2

## 2021-04-21 MED ORDER — ONDANSETRON HCL 4 MG/2ML IJ SOLN
INTRAMUSCULAR | Status: AC
  Filled 2021-04-21: qty 2

## 2021-04-21 MED ORDER — ONDANSETRON HCL 4 MG/2ML IJ SOLN: 4.0000 mg | Freq: Once | INTRAMUSCULAR | Status: DC | PRN

## 2021-04-21 MED ORDER — FENTANYL CITRATE (PF) 100 MCG/2ML IJ SOLN: 25.0000 ug | INTRAMUSCULAR | Status: DC | PRN

## 2021-04-21 MED ORDER — ONDANSETRON HCL 4 MG/2ML IJ SOLN
INTRAMUSCULAR | Status: DC | PRN
  Administered 2021-04-21: 4 mg via INTRAVENOUS

## 2021-04-21 MED ORDER — LIDOCAINE HCL (PF) 1 % IJ SOLN
INTRAMUSCULAR | Status: DC | PRN
  Administered 2021-04-21: 6 mL via INTRADERMAL

## 2021-04-21 MED ORDER — BUPIVACAINE HCL (PF) 0.5 % IJ SOLN
INTRAMUSCULAR | Status: DC | PRN
  Administered 2021-04-21: 20 mL

## 2021-04-21 SURGICAL SUPPLY — 54 items
APL PRP STRL LF DISP 70% ISPRP (MISCELLANEOUS) ×1
BANDAGE ELASTIC 4 VELCRO NS (GAUZE/BANDAGES/DRESSINGS) ×1 IMPLANT
BANDAGE ESMARK 4X12 BL STRL LF (DISPOSABLE) ×1 IMPLANT
BIT DRILL 2 (BIT) ×1 IMPLANT
BIT DRILL MICR ACTRK 2 LNG PRF (BIT) IMPLANT
BLADE AVERAGE 25X9 (BLADE) ×2 IMPLANT
BLADE OSC/SAG 11.5X5.5X.38 (BLADE) ×1 IMPLANT
BLADE OSC/SAGITTAL MD 9X18.5 (BLADE) ×1 IMPLANT
BLADE SURG 15 STRL LF DISP TIS (BLADE) ×2 IMPLANT
BLADE SURG 15 STRL SS (BLADE) ×6
BNDG CMPR 12X4 ELC STRL LF (DISPOSABLE) ×1
BNDG CMPR STD VLCR NS LF 5.8X4 (GAUZE/BANDAGES/DRESSINGS) ×1
BNDG CONFORM 2 STRL LF (GAUZE/BANDAGES/DRESSINGS) ×2 IMPLANT
BNDG ELASTIC 4X5.8 VLCR NS LF (GAUZE/BANDAGES/DRESSINGS) ×2 IMPLANT
BNDG ESMARK 4X12 BLUE STRL LF (DISPOSABLE) ×2
BNDG GAUZE ELAST 4 BULKY (GAUZE/BANDAGES/DRESSINGS) ×2 IMPLANT
BOOT STEPPER DURA MED (SOFTGOODS) ×1 IMPLANT
CHLORAPREP W/TINT 26 (MISCELLANEOUS) ×2 IMPLANT
CLOTH BEACON ORANGE TIMEOUT ST (SAFETY) ×2 IMPLANT
COVER LIGHT HANDLE STERIS (MISCELLANEOUS) ×4 IMPLANT
COVER WAND RF STERILE (DRAPES) ×3 IMPLANT
CUFF TOURN SGL QUICK 18X4 (TOURNIQUET CUFF) ×2 IMPLANT
DECANTER SPIKE VIAL GLASS SM (MISCELLANEOUS) ×3 IMPLANT
DRAPE OEC MINIVIEW 54X84 (DRAPES) ×2 IMPLANT
DRILL MICRO ACUTRAK 2 LNG PROF (BIT) ×2
DRSG ADAPTIC 3X8 NADH LF (GAUZE/BANDAGES/DRESSINGS) ×2 IMPLANT
ELECT REM PT RETURN 9FT ADLT (ELECTROSURGICAL) ×2
ELECTRODE REM PT RTRN 9FT ADLT (ELECTROSURGICAL) ×1 IMPLANT
GAUZE SPONGE 4X4 12PLY STRL (GAUZE/BANDAGES/DRESSINGS) ×2 IMPLANT
GLOVE SURG ENC MOIS LTX SZ7.5 (GLOVE) ×2 IMPLANT
GLOVE SURG LTX SZ7 (GLOVE) ×2 IMPLANT
GLOVE SURG UNDER POLY LF SZ7 (GLOVE) ×4 IMPLANT
GLOVE SURG UNDER POLY LF SZ7.5 (GLOVE) ×2 IMPLANT
GOWN STRL REUS W/ TWL LRG LVL3 (GOWN DISPOSABLE) ×1 IMPLANT
GOWN STRL REUS W/TWL LRG LVL3 (GOWN DISPOSABLE) ×5 IMPLANT
GUIDEWIRE ORTHO MICROSHT  ACUT (WIRE) ×1
GUIDEWIRE ORTHO MICROSHT .035 (WIRE) IMPLANT
KIT TURNOVER KIT A (KITS) ×2 IMPLANT
MANIFOLD NEPTUNE II (INSTRUMENTS) ×2 IMPLANT
NDL HYPO 27GX1-1/4 (NEEDLE) ×3 IMPLANT
NEEDLE HYPO 27GX1-1/4 (NEEDLE) ×6 IMPLANT
NS IRRIG 1000ML POUR BTL (IV SOLUTION) ×2 IMPLANT
PACK BASIC LIMB (CUSTOM PROCEDURE TRAY) ×2 IMPLANT
PAD ARMBOARD 7.5X6 YLW CONV (MISCELLANEOUS) ×3 IMPLANT
SCREW ACUTRAK 2 MICRO 20MM (Screw) ×1 IMPLANT
SET BASIN LINEN APH (SET/KITS/TRAYS/PACK) ×2 IMPLANT
SPONGE LAP 18X18 RF (DISPOSABLE) ×2 IMPLANT
STAPLE FIXATION 8X8X8 (Staple) ×1 IMPLANT
STRIP CLOSURE SKIN 1/2X4 (GAUZE/BANDAGES/DRESSINGS) ×4 IMPLANT
SUT PROLENE 4 0 PS 2 18 (SUTURE) ×3 IMPLANT
SUT VIC AB 2-0 CT2 27 (SUTURE) ×2 IMPLANT
SUT VIC AB 4-0 PS2 27 (SUTURE) ×3 IMPLANT
SUT VICRYL AB 3-0 FS1 BRD 27IN (SUTURE) ×2 IMPLANT
SYR CONTROL 10ML LL (SYRINGE) ×5 IMPLANT

## 2021-04-21 NOTE — Addendum Note (Signed)
Addendum  created 04/21/21 1117 by Alvy Bimler, CRNA   Flowsheet accepted

## 2021-04-21 NOTE — Anesthesia Preprocedure Evaluation (Signed)
Anesthesia Evaluation  Patient identified by MRN, date of birth, ID band Patient awake    Reviewed: Allergy & Precautions, NPO status , Patient's Chart, lab work & pertinent test results  History of Anesthesia Complications (+) PONV and history of anesthetic complications  Airway Mallampati: I  TM Distance: >3 FB Neck ROM: Full    Dental  (+) Dental Advisory Given, Teeth Intact, Caps   Pulmonary neg pulmonary ROS,    Pulmonary exam normal breath sounds clear to auscultation       Cardiovascular Exercise Tolerance: Good Normal cardiovascular exam Rhythm:Regular Rate:Normal     Neuro/Psych negative neurological ROS     GI/Hepatic negative GI ROS, Neg liver ROS,   Endo/Other  negative endocrine ROS  Renal/GU negative Renal ROS     Musculoskeletal negative musculoskeletal ROS (+)   Abdominal   Peds  Hematology negative hematology ROS (+)   Anesthesia Other Findings Bilateral breast cancer, chemo and radiation therapy  Reproductive/Obstetrics negative OB ROS                            Anesthesia Physical Anesthesia Plan  ASA: II  Anesthesia Plan: General   Post-op Pain Management:    Induction: Intravenous  PONV Risk Score and Plan: Propofol infusion and Ondansetron  Airway Management Planned: Nasal Cannula and Natural Airway  Additional Equipment:   Intra-op Plan:   Post-operative Plan:   Informed Consent: I have reviewed the patients History and Physical, chart, labs and discussed the procedure including the risks, benefits and alternatives for the proposed anesthesia with the patient or authorized representative who has indicated his/her understanding and acceptance.     Dental advisory given  Plan Discussed with: CRNA and Surgeon  Anesthesia Plan Comments:        Anesthesia Quick Evaluation

## 2021-04-21 NOTE — Transfer of Care (Signed)
Immediate Anesthesia Transfer of Care Note  Patient: Amy Gill  Procedure(s) Performed: Lillard Anes WITH OSTEOTOMY 1ST METATARSAL RIGHT FOOT (Right Toe) AIKEN OSTEOTOMY HALLUX RIGHT FOOT (Right Toe) TAILOR'S BUNIONECTOMY RIGHT FOOT (Right Toe) HAMMER TOE REPAIR 5TH DIGIT RIGHT FOOT (Right Toe)  Patient Location: PACU  Anesthesia Type:MAC  Level of Consciousness: awake, alert , oriented and patient cooperative  Airway & Oxygen Therapy: Patient Spontanous Breathing and Patient connected to nasal cannula oxygen  Post-op Assessment: Report given to RN, Post -op Vital signs reviewed and stable and Patient moving all extremities X 4  Post vital signs: Reviewed and stable  Last Vitals:  Vitals Value Taken Time  BP    Temp    Pulse    Resp    SpO2      Last Pain:  Vitals:   04/21/21 0645  PainSc: 0-No pain         Complications: No complications documented.

## 2021-04-21 NOTE — Progress Notes (Signed)
Huber Needle removed from Left Port a cath in left chest after flush with heparin. Site covered with paper tape and 2x2 gauze

## 2021-04-21 NOTE — Op Note (Signed)
OPERATIVE NOTE  DATE OF PROCEDURE 04/21/2021  SURGEON Marcheta Grammes, DPM  ASSISTANT SURGEON None  OR STAFF Circulator: Allie Dimmer, RN Radiology Technologist: Kayren Eaves T Scrub Person: Lucie Leather, CST Circulator Assistant: Laverta Baltimore, RN   PREOPERATIVE DIAGNOSIS 1.  Hallux valgus, right foot. 2.  Tailor's bunion, right foot. 3.  Hammertoe deformity of the fifth digit, right foot. 4.  Pain, right foot.  POSTOPERATIVE DIAGNOSIS Same  PROCEDURE 1.  Bunionectomy with distal first metatarsal osteotomy, right foot. 2.  Ostectomy of the fifth metatarsal head, right foot. 3.  Derotational arthroplasty of the fifth digit, right foot.  ANESTHESIA Monitor Anesthesia Care   HEMOSTASIS Pneumatic ankle tourniquet set at 250 mmHg  ESTIMATED BLOOD LOSS Minimal (<5 cc)  MATERIALS USED 1.  Acumed Acutrack 2 Headless Compression Screw Micro 20 mm length 2.  Stryker EasyClip 8 mm x 8 mm x 8 mm  INJECTABLES 0.5% Marcaine plain and 1% lidocaine plain  PATHOLOGY None  COMPLICATIONS None  IINDICATIONS FOR PROCEDURES: Right foot pain related to the diagnoses listed above.  Symptoms have not responded to nonsurgical care including: padding, nonsteroidal anti-inflammatory drugs and changes in shoegear.  PROCEDURES IN DETAIL:  The patient was brought to the operating room and placed on the operative table in the supine position.  A pneumatic ankle tourniquet was placed about the patient's right ankle.  A timeout was performed.  The right foot was anesthetized with 0.5% Marcaine plain.  The foot was scrubbed prepped and draped in the usual sterile manner.  A second timeout was performed.  The foot was elevated, exsanguinated and the pneumatic ankle tourniquet was inflated to 250 mmHg.  Attention was directed to the dorsomedial aspect of the right foot.  A linear longitudinal incision was made medial and parallel to the extensor hallucis longus tendon involving  the contour of the deformity.  Dissection was continued deep down to the level of the first metatarsophalangeal joint.  A T-shaped capsulotomy was performed.  The periosteal and capsular structures were reflected exposing the first metatarsal head and proximal phalanx.  The prominent medial eminence of the first metatarsal head was resected using a power bone saw and passed from the operative field.  Attention was directed to the first interspace through the original skin incision.  The conjoined tendon of the adductor hallucis muscle was identified and transected at its attachment to the base the proximal phalanx.  Attention was redirected to the medial aspect of the first metatarsal head.  A chevron osteotomy was performed in the metaphyseal region of the bone.  The capital fragment was distracted and shifted into a more corrected lateral position.  The osteotomy was fixated with an Acumed AccuTrack 2 micro screw.  Adequate compression was achieved.  Position of the screw was confirmed with fluoroscopy.  The remaining medial bone shelf was resected and passed from the operative field.  Additional local anesthesia (1% lidocaine plain) was injected.  There was persistent lateral deviation of the hallux when loaded.  Attention was directed to the base of the proximal phalanx of the hallux.  A wedge shaped osteotomy was performed with the apex oriented laterally and the base medially.  The wedge of bone was removed and passed from the operative field.  The lateral hinge was feathered to allow for adequate closure of the osteotomy.  The osteotomy was fixated with a Stryker EasyClip staple.  Position of the staple was confirmed with fluoroscopy.  The surgical wound was irrigated with  copious amounts of sterile saline.  A medial capsulorrhaphy was performed using 2-0 Vicryl.  The remaining periosteal and capsular structures were reapproximated using 3-0 Vicryl.  The subcutaneous structures was reapproximated using  4-0 Vicryl.  The skin was reapproximated using 4-0 Vicryl in a running subcuticular manner.  Skin closure was reinforced with 4-0 Prolene.  Attention was directed to the dorsal lateral aspect of the right foot.  A linear longitudinal incision was made overlying the fifth metatarsal head.  Dissection was continued deep to the fifth metatarsophalangeal joint.  A linear longitudinal periosteal and capsular incision was made over the fifth metatarsophalangeal joint.  The periosteal and capsular structures were reflected exposing the lateral aspect of the fifth metatarsal head.  The prominent lateral aspect of the fifth metatarsal head was resected using a power bone saw.  The surgical wound was irrigated with copious amounts of sterile saline.  The periosteal capsular structures were reapproximated using 4-0 Vicryl.  The subcutaneous structures reapproximated using 4-0 Vicryl.  The skin closure was reinforced with 4-0 Prolene.  Attention was directed to the dorsal aspect of the fifth toe.  2 converging semielliptical incisions were made in n oblique fashion over the proximal interphalangeal joint from a proximal lateral to distal medial direction.  The wedge of skin was removed and passed from the operative field.  The skin edges were undermined to aid in skin closure.  A transverse tenotomy and capsulotomy was performed at the level of the proximal interphalangeal joint.  The head of the proximal phalanx was resected using a power bone saw and passed from the operative field.  The surgical wound was irrigated with copious amounts of sterile irrigant.  The toe was derotated and the extensor tendon was reapproximated using 4-0 Vicryl.  The subcutaneous structures were reapproximated using 4-0 Vicryl.  The skin was reapproximated using 4-0 Prolene.  Steri-Strips were applied to each incision.  A sterile compressive dressing was applied to the right foot.  The pneumatic ankle tourniquet was deflated and a prompt  hyperemic response was noted to all digits of the right foot.  The patient tolerated the procedure and anesthesia well.  She was transferred from the operating room to the postanesthesia care unit with vital signs stable and vascular status intact to all digits of the right foot.

## 2021-04-21 NOTE — Discharge Instructions (Addendum)
These instructions will give you an idea of what to expect after surgery and how to manage issues that may arise before your first post op office visit.  Pain Management Pain is best managed by "staying ahead" of it. If pain gets out of control, it is difficult to get it back under control. Local anesthesia that lasts 6-8 hours is used to numb the foot and decrease pain.  For the best pain control, take the pain medication every 4 hours for the first 2 days post op. On the third day pain medication can be taken as needed.   Post Op Nausea Nausea is common after surgery, so it is managed proactively.  If prescribed, use the prescribed nausea medication regularly for the first 2 days post op.  Bandages Do not worry if there is blood on the bandage. What looks like a lot of blood on the bandage is actually a small amount. Blood on the dressing spreads out as it is absorbed by the gauze, the same way a drop of water spreads out on a paper towel.  If the bandages feel wet or dry, stiff and uncomfortable, call the office during office hours and we will schedule a time for you to have the bandage changed.  Unless you are specifically told otherwise, we will do the first bandage change in the office.  Keep your bandage dry. If the bandage becomes wet or soiled, notify the office and we will schedule a time to change the bandage.  Activity It is best to spend most of the first 2 days after surgery lying down with the foot elevated above the level of your heart. You may put weight on your heel while wearing the CAM Walker (black boot).   You may only get up to go to the restroom.  Driving Do not drive until you are able to respond in an emergency (i.e. slam on the brakes). This usually occurs after the bone has healed - 6 to 8 weeks.  Call the Office If you have a fever over 101F.  If you have increasing pain after the initial post op pain has settled down.  If you have increasing redness, swelling,  or drainage.  If you have any questions or concerns.     Monitored Anesthesia Care, Care After This sheet gives you information about how to care for yourself after your procedure. Your health care provider may also give you more specific instructions. If you have problems or questions, contact your health care provider. What can I expect after the procedure? After the procedure, it is common to have:  Tiredness.  Forgetfulness about what happened after the procedure.  Impaired judgment for important decisions.  Nausea or vomiting.  Some difficulty with balance. Follow these instructions at home: For the time period you were told by your health care provider:  Rest as needed.  Do not participate in activities where you could fall or become injured.  Do not drive or use machinery.  Do not drink alcohol.  Do not take sleeping pills or medicines that cause drowsiness.  Do not make important decisions or sign legal documents.  Do not take care of children on your own.      Eating and drinking  Follow the diet that is recommended by your health care provider.  Drink enough fluid to keep your urine pale yellow.  If you vomit: ? Drink water, juice, or soup when you can drink without vomiting. ? Make sure you have little  or no nausea before eating solid foods. General instructions  Have a responsible adult stay with you for the time you are told. It is important to have someone help care for you until you are awake and alert.  Take over-the-counter and prescription medicines only as told by your health care provider.  If you have sleep apnea, surgery and certain medicines can increase your risk for breathing problems. Follow instructions from your health care provider about wearing your sleep device: ? Anytime you are sleeping, including during daytime naps. ? While taking prescription pain medicines, sleeping medicines, or medicines that make you drowsy.  Avoid  smoking.  Keep all follow-up visits as told by your health care provider. This is important. Contact a health care provider if:  You keep feeling nauseous or you keep vomiting.  You feel light-headed.  You are still sleepy or having trouble with balance after 24 hours.  You develop a rash.  You have a fever.  You have redness or swelling around the IV site. Get help right away if:  You have trouble breathing.  You have new-onset confusion at home. Summary  For several hours after your procedure, you may feel tired. You may also be forgetful and have poor judgment.  Have a responsible adult stay with you for the time you are told. It is important to have someone help care for you until you are awake and alert.  Rest as told. Do not drive or operate machinery. Do not drink alcohol or take sleeping pills.  Get help right away if you have trouble breathing, or if you suddenly become confused. This information is not intended to replace advice given to you by your health care provider. Make sure you discuss any questions you have with your health care provider. Document Revised: 08/13/2020 Document Reviewed: 10/31/2019 Elsevier Patient Education  2021 [employer].

## 2021-04-21 NOTE — Anesthesia Postprocedure Evaluation (Signed)
Anesthesia Post Note  Patient: Amy Gill  Procedure(s) Performed: Lillard Anes WITH OSTEOTOMY 1ST METATARSAL RIGHT FOOT (Right Toe) AIKEN OSTEOTOMY HALLUX RIGHT FOOT (Right Toe) TAILOR'S BUNIONECTOMY RIGHT FOOT (Right Toe) HAMMER TOE REPAIR 5TH DIGIT RIGHT FOOT (Right Toe)  Patient location during evaluation: Phase II Anesthesia Type: General Level of consciousness: awake and alert and oriented Pain management: pain level controlled Vital Signs Assessment: post-procedure vital signs reviewed and stable Respiratory status: spontaneous breathing and respiratory function stable Cardiovascular status: blood pressure returned to baseline and stable Postop Assessment: no apparent nausea or vomiting Anesthetic complications: no   No complications documented.   Last Vitals:  Vitals:   04/21/21 1015 04/21/21 1046  BP: 122/75 121/64  Pulse: 71 62  Resp: 12 16  Temp:  36.8 C  SpO2: 100% 100%    Last Pain:  Vitals:   04/21/21 1046  TempSrc: Oral  PainSc: 0-No pain                 Drenda Sobecki C Marchella Hibbard

## 2021-04-21 NOTE — Brief Op Note (Signed)
BRIEF OPERATIVE NOTE  DATE OF PROCEDURE 04/21/2021  SURGEON Marcheta Grammes, DPM  ASSISTANT SURGEON None  OR STAFF Circulator: Allie Dimmer, RN Radiology Technologist: Kayren Eaves T Scrub Person: Lucie Leather, CST Circulator Assistant: Laverta Baltimore, RN   PREOPERATIVE DIAGNOSIS 1.  Hallux valgus, right foot. 2.  Tailor's bunion, right foot. 3.  Hammertoe deformity of the fifth digit, right foot. 4.  Pain, right foot.  POSTOPERATIVE DIAGNOSIS Same  PROCEDURE 1.  Bunionectomy with distal first metatarsal osteotomy, right foot. 2.  Ostectomy of the fifth metatarsal head, right foot. 3.  Derotational arthroplasty of the fifth digit, right foot.  ANESTHESIA Monitor Anesthesia Care   HEMOSTASIS Pneumatic ankle tourniquet set at 250 mmHg  ESTIMATED BLOOD LOSS Minimal (<5 cc)  MATERIALS USED 1.  Acumed Acutrack 2 Headless Compression Screw Micro 20 mm length 2.  Stryker EasyClip [8] mm x [8] mm x [8] mm  INJECTABLES 0.5% Marcaine plain and 1% lidocaine plain  PATHOLOGY None  COMPLICATIONS None

## 2021-04-21 NOTE — H&P (Signed)
HISTORY AND PHYSICAL INTERVAL NOTE:  04/21/2021  7:13 AM  Amy Gill  has presented today for surgery, with the diagnosis of hallux valgus right foot, tailor's bunion right foot ,hammer toe deformity 5th digit right foot, pain right foot.  The various methods of treatment have been discussed with the patient.  No guarantees were given.  After consideration of risks, benefits and other options for treatment, the patient has consented to surgery.  I have reviewed the patients' chart and labs.    Patient Vitals for the past 24 hrs:  BP Temp Pulse Resp SpO2  04/21/21 0700 132/76 -- (!) 59 16 100 %  04/21/21 0645 134/82 98.2 F (36.8 C) 68 14 100 %    A history and physical examination was performed in my office.  The patient was reexamined.  There have been no changes to this history and physical examination.  Marcheta Grammes, DPM

## 2021-04-23 ENCOUNTER — Encounter (HOSPITAL_COMMUNITY): Payer: Self-pay | Admitting: Podiatry

## 2021-11-28 IMAGING — DX DG FOOT COMPLETE 3+V*R*
3 series · 3 of 3 positions shown · non-contrast
Comparison: Intraoperative radiographs same date. Radiographs
04/19/2021 and 03/27/2014.

CLINICAL DATA: Right foot surgical repair.

EXAM:
RIGHT FOOT COMPLETE - 3+ VIEW

[foot ap]
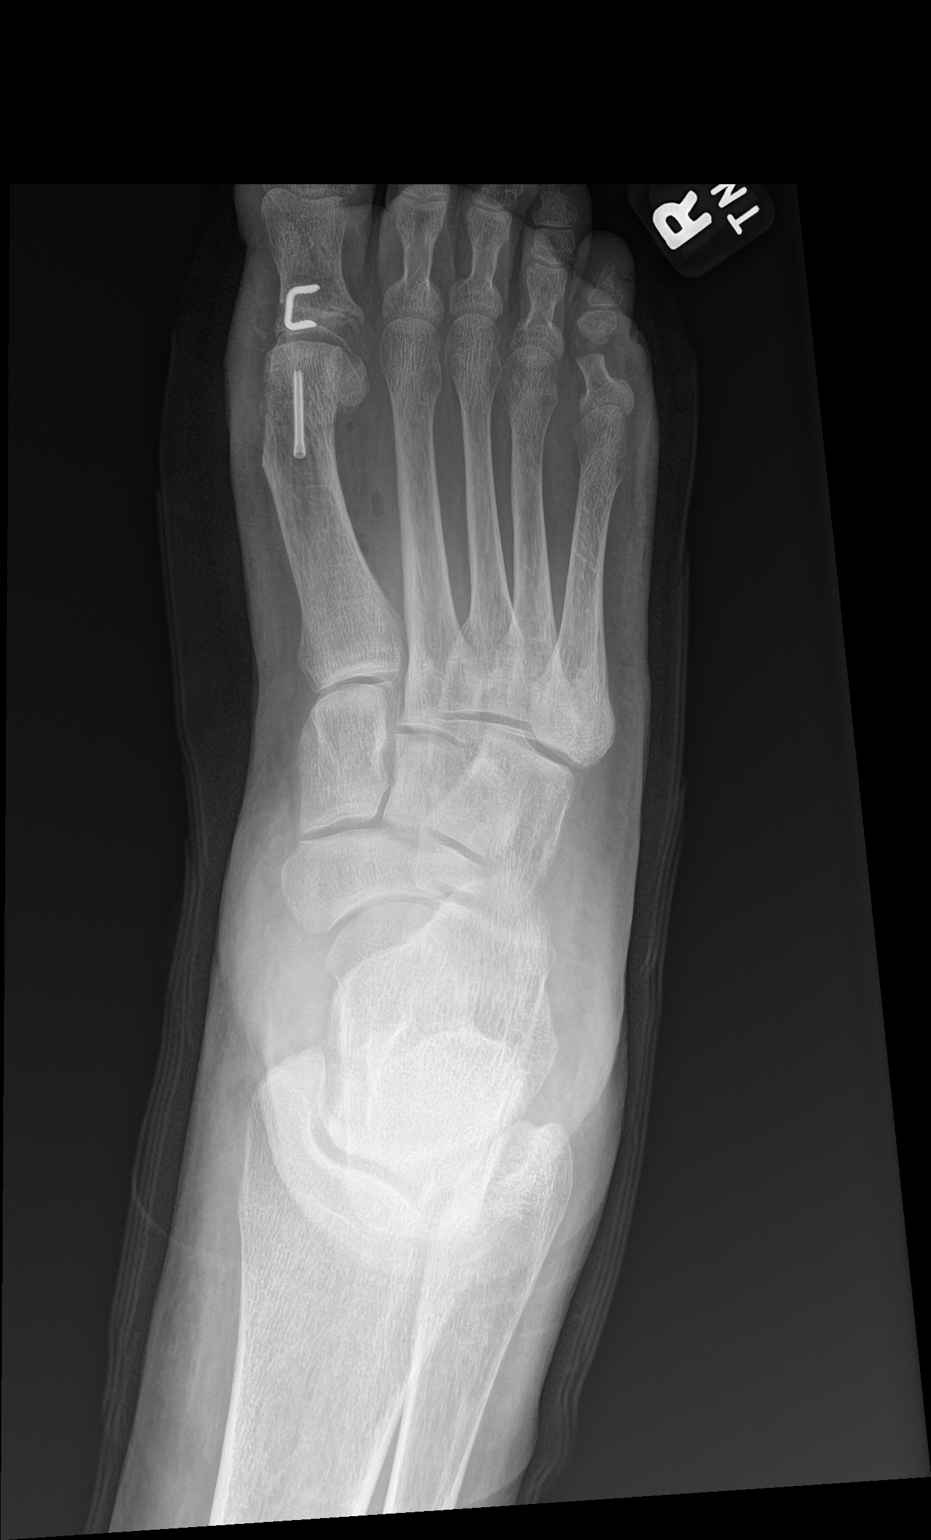

[foot obl]
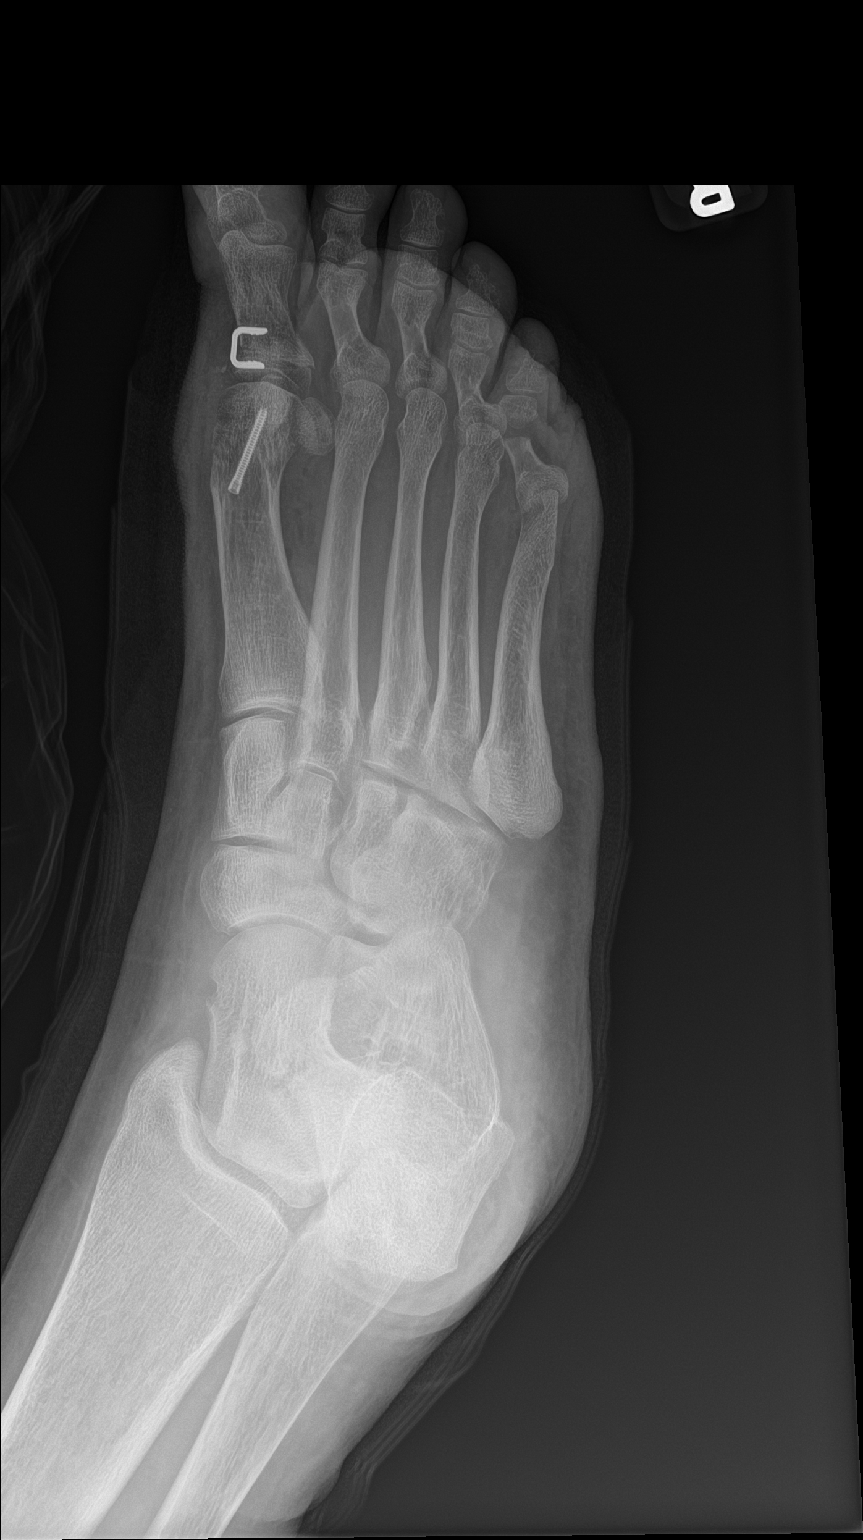

[foot lat]
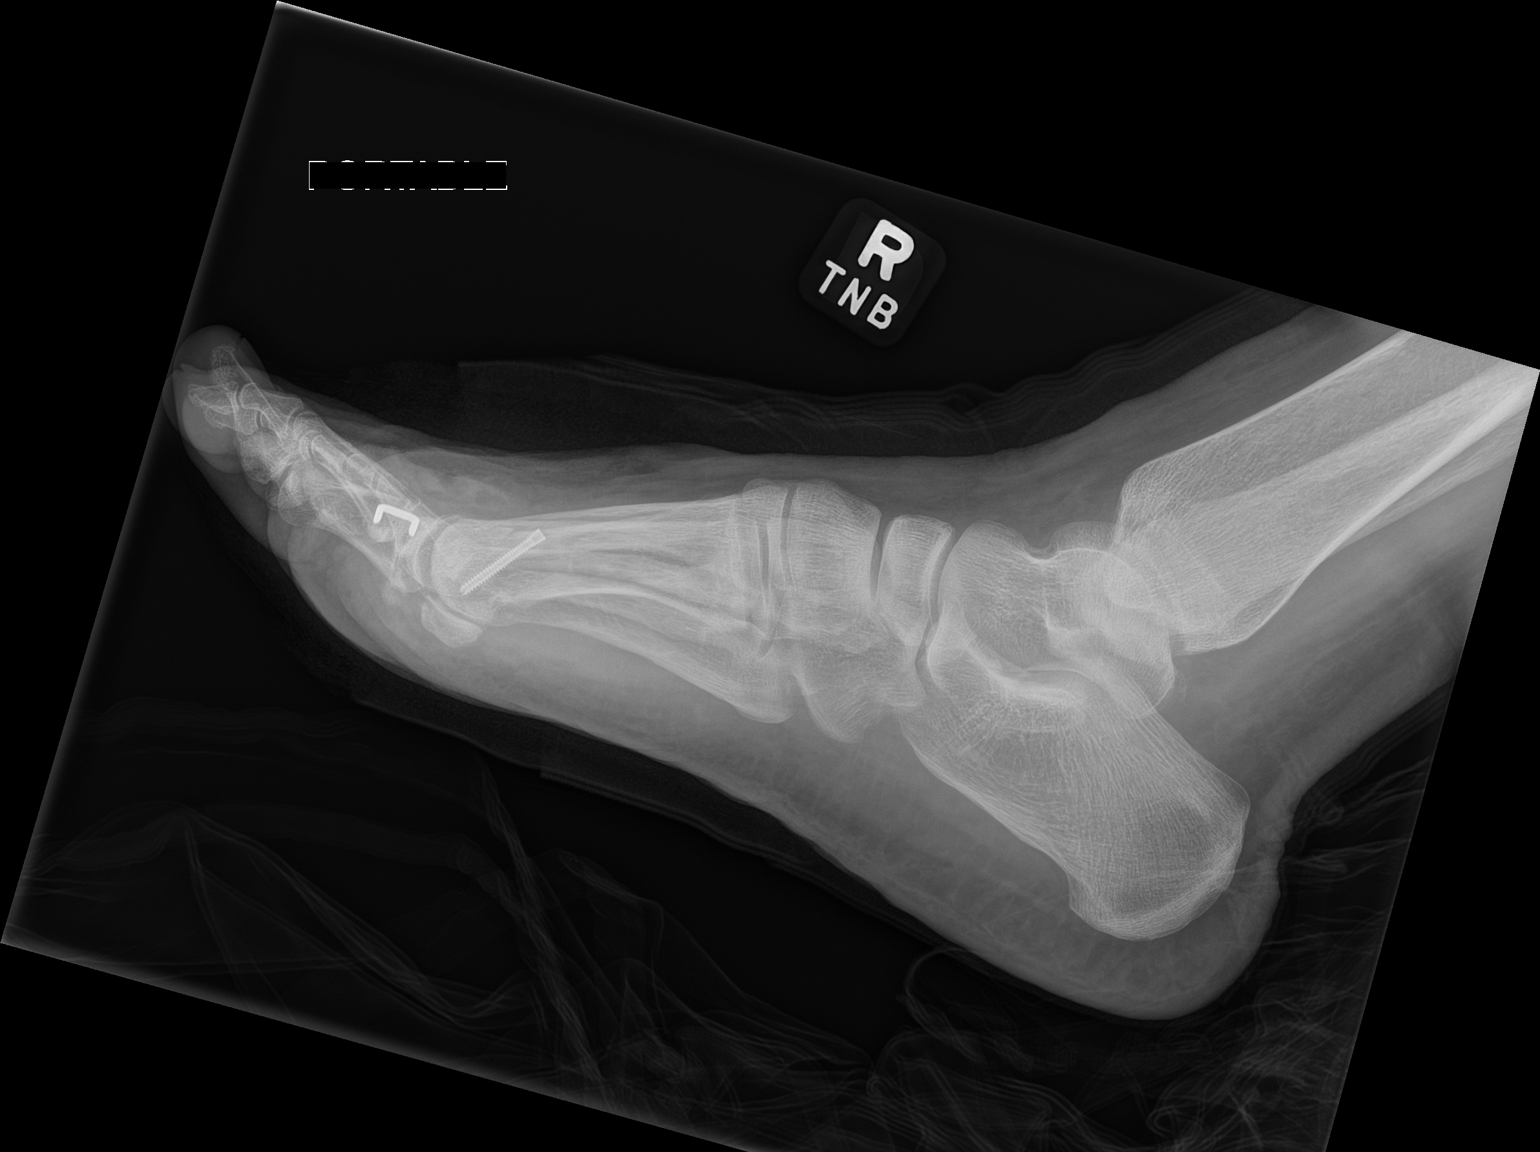

[3 of 3 positions shown; findings below may reference images not displayed]

FINDINGS: Interval postsurgical changes consistent with 1st metatarsal head
bunionectomy and osteotomy. There is a small threaded screw within
the 1st metatarsal neck. Probable osteotomy of the base of the 1st
proximal phalanx with a dorsal metallic staple. Resection of the 5th
proximal phalangeal head has also been performed. No evidence of
acute fracture or other complication. There is forefoot soft tissue
swelling and a small amount of soft tissue gas.
IMPRESSION: Postsurgical changes around the 1st metatarsophalangeal joint and
involving the 5th proximal phalangeal head as described. No
demonstrated complications.
